# Patient Record
Sex: Female | Born: 1945 | Race: White | Hispanic: No | Marital: Married | Smoking: Never smoker
Health system: Southern US, Community
[De-identification: ages and names within clinical notes are randomized; demographics above are authoritative.]

## PROBLEM LIST (undated history)

## (undated) HISTORY — PX: MASTECTOMY: SHX3

## (undated) HISTORY — PX: ABDOMINAL HYSTERECTOMY: SHX81

---

## 2018-03-06 ENCOUNTER — Emergency Department: Payer: No Typology Code available for payment source

## 2018-03-06 ENCOUNTER — Encounter: Payer: Self-pay | Admitting: Emergency Medicine

## 2018-03-06 ENCOUNTER — Emergency Department
Admission: EM | Admit: 2018-03-06 | Discharge: 2018-03-06 | Disposition: A | Discharge status: Home / Self Care | Payer: No Typology Code available for payment source | Attending: Emergency Medicine | Admitting: Emergency Medicine

## 2018-03-06 ENCOUNTER — Other Ambulatory Visit: Payer: Self-pay

## 2018-03-06 DIAGNOSIS — S161XXA Strain of muscle, fascia and tendon at neck level, initial encounter: Secondary | ICD-10-CM | POA: Insufficient documentation

## 2018-03-06 DIAGNOSIS — Y939 Activity, unspecified: Secondary | ICD-10-CM | POA: Diagnosis not present

## 2018-03-06 DIAGNOSIS — M542 Cervicalgia: Secondary | ICD-10-CM

## 2018-03-06 DIAGNOSIS — S0990XA Unspecified injury of head, initial encounter: Secondary | ICD-10-CM

## 2018-03-06 DIAGNOSIS — Y998 Other external cause status: Secondary | ICD-10-CM | POA: Diagnosis not present

## 2018-03-06 DIAGNOSIS — Z9010 Acquired absence of unspecified breast and nipple: Secondary | ICD-10-CM | POA: Insufficient documentation

## 2018-03-06 DIAGNOSIS — Y92410 Unspecified street and highway as the place of occurrence of the external cause: Secondary | ICD-10-CM | POA: Insufficient documentation

## 2018-03-06 MED ORDER — IBUPROFEN 400 MG PO TABS
ORAL_TABLET | ORAL | Status: AC
  Administered 2018-03-06: 400 mg via ORAL
  Filled 2018-03-06: qty 1

## 2018-03-06 MED ORDER — IBUPROFEN 400 MG PO TABS
400.0000 mg | ORAL_TABLET | Freq: Once | ORAL | Status: AC
  Administered 2018-03-06: 400 mg via ORAL
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
## 2018-03-06 NOTE — Discharge Instructions (Addendum)
I would advise taking low-dose Motrin for the pain, fear of severe headache, vomiting, numbness, weakness, lethargy, pain in any extremity that we did not x-ray or any other concerns please return to the emergency department.  Follow closely with your doctor as indicated tomorrow.
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
